# Patient Record
Sex: Male | Born: 1990 | Race: White | Hispanic: No | Marital: Married | Smoking: Former smoker
Health system: Southern US, Community
[De-identification: ages and names within clinical notes are randomized; demographics above are authoritative.]

## PROBLEM LIST (undated history)

## (undated) DIAGNOSIS — S069XAA Unspecified intracranial injury with loss of consciousness status unknown, initial encounter: Secondary | ICD-10-CM

## (undated) DIAGNOSIS — Z8739 Personal history of other diseases of the musculoskeletal system and connective tissue: Secondary | ICD-10-CM

## (undated) DIAGNOSIS — G43909 Migraine, unspecified, not intractable, without status migrainosus: Secondary | ICD-10-CM

## (undated) DIAGNOSIS — G629 Polyneuropathy, unspecified: Secondary | ICD-10-CM

## (undated) DIAGNOSIS — M1990 Unspecified osteoarthritis, unspecified site: Secondary | ICD-10-CM

## (undated) DIAGNOSIS — F4310 Post-traumatic stress disorder, unspecified: Secondary | ICD-10-CM

## (undated) DIAGNOSIS — E079 Disorder of thyroid, unspecified: Secondary | ICD-10-CM

## (undated) DIAGNOSIS — S069X9A Unspecified intracranial injury with loss of consciousness of unspecified duration, initial encounter: Secondary | ICD-10-CM

## (undated) HISTORY — DX: Personal history of other diseases of the musculoskeletal system and connective tissue: Z87.39

## (undated) HISTORY — DX: Polyneuropathy, unspecified: G62.9

## (undated) HISTORY — PX: NASAL SEPTUM SURGERY: SHX37

## (undated) HISTORY — DX: Unspecified osteoarthritis, unspecified site: M19.90

## (undated) HISTORY — DX: Disorder of thyroid, unspecified: E07.9

## (undated) HISTORY — PX: LASIK: SHX215

## (undated) HISTORY — PX: WISDOM TOOTH EXTRACTION: SHX21

## (undated) HISTORY — DX: Migraine, unspecified, not intractable, without status migrainosus: G43.909

---

## 2016-09-01 ENCOUNTER — Other Ambulatory Visit: Payer: Self-pay | Admitting: Orthopedic Surgery

## 2016-09-01 DIAGNOSIS — S62211A Bennett's fracture, right hand, initial encounter for closed fracture: Secondary | ICD-10-CM

## 2016-09-04 ENCOUNTER — Ambulatory Visit
Admission: RE | Admit: 2016-09-04 | Discharge: 2016-09-04 | Disposition: A | Discharge status: Home / Self Care | Payer: No Typology Code available for payment source | Source: Ambulatory Visit | Attending: Orthopedic Surgery | Admitting: Orthopedic Surgery

## 2016-09-04 DIAGNOSIS — S62211A Bennett's fracture, right hand, initial encounter for closed fracture: Secondary | ICD-10-CM

## 2016-09-05 ENCOUNTER — Other Ambulatory Visit: Payer: Self-pay

## 2016-09-12 ENCOUNTER — Encounter (HOSPITAL_COMMUNITY): Payer: Self-pay | Admitting: *Deleted

## 2016-09-13 NOTE — Progress Notes (Signed)
SDW-pre-op call completed yesterday; pt updated with new arrival time for surgery tomorrow ( 2:15pm)

## 2016-09-14 ENCOUNTER — Encounter (HOSPITAL_COMMUNITY): Payer: Self-pay | Admitting: *Deleted

## 2016-09-14 ENCOUNTER — Ambulatory Visit (HOSPITAL_COMMUNITY)
Admission: RE | Admit: 2016-09-14 | Discharge: 2016-09-14 | Disposition: A | Discharge status: Home / Self Care | Payer: Non-veteran care | Source: Ambulatory Visit | Attending: Orthopedic Surgery | Admitting: Orthopedic Surgery

## 2016-09-14 ENCOUNTER — Ambulatory Visit (HOSPITAL_COMMUNITY): Payer: Non-veteran care | Admitting: Certified Registered"

## 2016-09-14 ENCOUNTER — Encounter (HOSPITAL_COMMUNITY)
Admission: RE | Disposition: A | Discharge status: Home / Self Care | Payer: Self-pay | Source: Ambulatory Visit | Attending: Orthopedic Surgery

## 2016-09-14 DIAGNOSIS — S62231P Other displaced fracture of base of first metacarpal bone, right hand, subsequent encounter for fracture with malunion: Secondary | ICD-10-CM | POA: Diagnosis not present

## 2016-09-14 DIAGNOSIS — F4310 Post-traumatic stress disorder, unspecified: Secondary | ICD-10-CM | POA: Diagnosis not present

## 2016-09-14 DIAGNOSIS — Z87891 Personal history of nicotine dependence: Secondary | ICD-10-CM | POA: Diagnosis not present

## 2016-09-14 DIAGNOSIS — Z87820 Personal history of traumatic brain injury: Secondary | ICD-10-CM | POA: Diagnosis not present

## 2016-09-14 HISTORY — DX: Post-traumatic stress disorder, unspecified: F43.10

## 2016-09-14 HISTORY — PX: PERCUTANEOUS PINNING: SHX2209

## 2016-09-14 HISTORY — DX: Unspecified intracranial injury with loss of consciousness of unspecified duration, initial encounter: S06.9X9A

## 2016-09-14 HISTORY — DX: Unspecified intracranial injury with loss of consciousness status unknown, initial encounter: S06.9XAA

## 2016-09-14 LAB — CBC
HEMATOCRIT: 41.7 % (ref 39.0–52.0)
HEMOGLOBIN: 14.7 g/dL (ref 13.0–17.0)
MCH: 30.7 pg (ref 26.0–34.0)
MCHC: 35.3 g/dL (ref 30.0–36.0)
MCV: 87.1 fL (ref 78.0–100.0)
Platelets: 274 10*3/uL (ref 150–400)
RBC: 4.79 MIL/uL (ref 4.22–5.81)
RDW: 12.4 % (ref 11.5–15.5)
WBC: 8.2 10*3/uL (ref 4.0–10.5)

## 2016-09-14 SURGERY — PINNING, EXTREMITY, PERCUTANEOUS
Anesthesia: General | Site: Hand | Laterality: Right

## 2016-09-14 MED ORDER — LIDOCAINE 2% (20 MG/ML) 5 ML SYRINGE
INTRAMUSCULAR | Status: AC
  Filled 2016-09-14: qty 5

## 2016-09-14 MED ORDER — FENTANYL CITRATE (PF) 100 MCG/2ML IJ SOLN
INTRAMUSCULAR | Status: DC | PRN
  Administered 2016-09-14 (×2): 50 ug via INTRAVENOUS
  Administered 2016-09-14: 100 ug via INTRAVENOUS
  Administered 2016-09-14 (×4): 50 ug via INTRAVENOUS

## 2016-09-14 MED ORDER — LABETALOL HCL 5 MG/ML IV SOLN
INTRAVENOUS | Status: AC
  Filled 2016-09-14: qty 4

## 2016-09-14 MED ORDER — CEFAZOLIN SODIUM 1 G IJ SOLR
INTRAMUSCULAR | Status: AC
  Filled 2016-09-14: qty 20

## 2016-09-14 MED ORDER — LACTATED RINGERS IV SOLN
INTRAVENOUS | Status: DC
  Administered 2016-09-14 (×3): via INTRAVENOUS

## 2016-09-14 MED ORDER — FENTANYL CITRATE (PF) 100 MCG/2ML IJ SOLN
INTRAMUSCULAR | Status: AC
  Filled 2016-09-14: qty 4

## 2016-09-14 MED ORDER — HYDROMORPHONE HCL 1 MG/ML IJ SOLN
INTRAMUSCULAR | Status: AC
  Administered 2016-09-14: 0.5 mg via INTRAVENOUS
  Filled 2016-09-14: qty 1

## 2016-09-14 MED ORDER — HYDROMORPHONE HCL 1 MG/ML IJ SOLN
1.0000 mg | Freq: Once | INTRAMUSCULAR | Status: AC
  Administered 2016-09-14: 1 mg via INTRAVENOUS

## 2016-09-14 MED ORDER — FENTANYL CITRATE (PF) 100 MCG/2ML IJ SOLN
INTRAMUSCULAR | Status: AC
  Filled 2016-09-14: qty 2

## 2016-09-14 MED ORDER — PHENYLEPHRINE 40 MCG/ML (10ML) SYRINGE FOR IV PUSH (FOR BLOOD PRESSURE SUPPORT)
PREFILLED_SYRINGE | INTRAVENOUS | Status: AC
  Filled 2016-09-14: qty 30

## 2016-09-14 MED ORDER — FENTANYL CITRATE (PF) 100 MCG/2ML IJ SOLN
INTRAMUSCULAR | Status: AC
  Administered 2016-09-14: 100 ug via INTRAVENOUS
  Filled 2016-09-14: qty 2

## 2016-09-14 MED ORDER — PROPOFOL 10 MG/ML IV BOLUS
INTRAVENOUS | Status: AC
  Filled 2016-09-14: qty 20

## 2016-09-14 MED ORDER — ONDANSETRON HCL 4 MG/2ML IJ SOLN: 4.0000 mg | Freq: Once | INTRAMUSCULAR | Status: DC | PRN

## 2016-09-14 MED ORDER — OXYCODONE HCL 5 MG PO TABS
5.0000 mg | ORAL_TABLET | Freq: Once | ORAL | Status: AC
  Administered 2016-09-14: 5 mg via ORAL

## 2016-09-14 MED ORDER — PROPOFOL 10 MG/ML IV BOLUS
INTRAVENOUS | Status: DC | PRN
  Administered 2016-09-14: 300 mg via INTRAVENOUS

## 2016-09-14 MED ORDER — MEPERIDINE HCL 25 MG/ML IJ SOLN: 6.2500 mg | INTRAMUSCULAR | Status: DC | PRN

## 2016-09-14 MED ORDER — MIDAZOLAM HCL 2 MG/2ML IJ SOLN
INTRAMUSCULAR | Status: AC
  Filled 2016-09-14: qty 2

## 2016-09-14 MED ORDER — OXYCODONE HCL 5 MG PO TABS
ORAL_TABLET | ORAL | Status: AC
  Filled 2016-09-14: qty 1

## 2016-09-14 MED ORDER — LIDOCAINE HCL (CARDIAC) 20 MG/ML IV SOLN
INTRAVENOUS | Status: DC | PRN
  Administered 2016-09-14: 20 mg via INTRATRACHEAL

## 2016-09-14 MED ORDER — DEXAMETHASONE SODIUM PHOSPHATE 10 MG/ML IJ SOLN
INTRAMUSCULAR | Status: AC
  Filled 2016-09-14: qty 1

## 2016-09-14 MED ORDER — CEFAZOLIN SODIUM 1 G IJ SOLR
INTRAMUSCULAR | Status: DC | PRN
  Administered 2016-09-14: 2 g via INTRAMUSCULAR

## 2016-09-14 MED ORDER — LIDOCAINE 2% (20 MG/ML) 5 ML SYRINGE
INTRAMUSCULAR | Status: AC
  Filled 2016-09-14: qty 20

## 2016-09-14 MED ORDER — EPHEDRINE 5 MG/ML INJ
INTRAVENOUS | Status: AC
  Filled 2016-09-14: qty 20

## 2016-09-14 MED ORDER — ONDANSETRON HCL 4 MG/2ML IJ SOLN
INTRAMUSCULAR | Status: AC
  Filled 2016-09-14: qty 10

## 2016-09-14 MED ORDER — DEXAMETHASONE SODIUM PHOSPHATE 10 MG/ML IJ SOLN
INTRAMUSCULAR | Status: DC | PRN
  Administered 2016-09-14: 10 mg via INTRAVENOUS

## 2016-09-14 MED ORDER — FENTANYL CITRATE (PF) 100 MCG/2ML IJ SOLN
100.0000 ug | Freq: Once | INTRAMUSCULAR | Status: AC
  Administered 2016-09-14: 100 ug via INTRAVENOUS
  Filled 2016-09-14: qty 2

## 2016-09-14 MED ORDER — HYDROMORPHONE HCL 1 MG/ML IJ SOLN
INTRAMUSCULAR | Status: AC
  Filled 2016-09-14: qty 1

## 2016-09-14 MED ORDER — HYDROMORPHONE HCL 1 MG/ML IJ SOLN
0.2500 mg | INTRAMUSCULAR | Status: DC | PRN
  Administered 2016-09-14 (×4): 0.5 mg via INTRAVENOUS

## 2016-09-14 MED ORDER — MIDAZOLAM HCL 2 MG/2ML IJ SOLN
INTRAMUSCULAR | Status: DC | PRN
  Administered 2016-09-14: 2 mg via INTRAVENOUS

## 2016-09-14 MED ORDER — OXYCODONE HCL 5 MG PO TABS
5.0000 mg | ORAL_TABLET | Freq: Once | ORAL | Status: AC | PRN
  Administered 2016-09-14: 5 mg via ORAL

## 2016-09-14 MED ORDER — DEXMEDETOMIDINE HCL IN NACL 200 MCG/50ML IV SOLN
INTRAVENOUS | Status: AC
  Filled 2016-09-14: qty 50

## 2016-09-14 MED ORDER — DEXMEDETOMIDINE HCL 200 MCG/2ML IV SOLN
INTRAVENOUS | Status: DC | PRN
  Administered 2016-09-14 (×3): 4 ug via INTRAVENOUS
  Administered 2016-09-14: 16 ug via INTRAVENOUS

## 2016-09-14 MED ORDER — 0.9 % SODIUM CHLORIDE (POUR BTL) OPTIME
TOPICAL | Status: DC | PRN
  Administered 2016-09-14: 1000 mL

## 2016-09-14 MED ORDER — OXYCODONE HCL 5 MG/5ML PO SOLN: 5.0000 mg | Freq: Once | ORAL | Status: AC | PRN

## 2016-09-14 MED ORDER — SUCCINYLCHOLINE CHLORIDE 200 MG/10ML IV SOSY
PREFILLED_SYRINGE | INTRAVENOUS | Status: AC
  Filled 2016-09-14: qty 30

## 2016-09-14 MED ORDER — ONDANSETRON HCL 4 MG/2ML IJ SOLN
INTRAMUSCULAR | Status: DC | PRN
  Administered 2016-09-14: 4 mg via INTRAVENOUS

## 2016-09-14 SURGICAL SUPPLY — 44 items
BANDAGE ACE 3X5.8 VEL STRL LF (GAUZE/BANDAGES/DRESSINGS) ×3 IMPLANT
BANDAGE ACE 4X5 VEL STRL LF (GAUZE/BANDAGES/DRESSINGS) IMPLANT
BANDAGE ELASTIC 3 VELCRO ST LF (GAUZE/BANDAGES/DRESSINGS) ×3 IMPLANT
BENZOIN TINCTURE PRP APPL 2/3 (GAUZE/BANDAGES/DRESSINGS) IMPLANT
BLADE SURG ROTATE 9660 (MISCELLANEOUS) IMPLANT
BNDG CONFORM 2 STRL LF (GAUZE/BANDAGES/DRESSINGS) ×3 IMPLANT
BNDG ELASTIC 2 VLCR STRL LF (GAUZE/BANDAGES/DRESSINGS) ×3 IMPLANT
BNDG ESMARK 4X9 LF (GAUZE/BANDAGES/DRESSINGS) ×3 IMPLANT
BNDG GAUZE ELAST 4 BULKY (GAUZE/BANDAGES/DRESSINGS) ×3 IMPLANT
CLOSURE WOUND 1/2 X4 (GAUZE/BANDAGES/DRESSINGS)
COVER SURGICAL LIGHT HANDLE (MISCELLANEOUS) ×3 IMPLANT
CUFF TOURNIQUET SINGLE 18IN (TOURNIQUET CUFF) IMPLANT
CUFF TOURNIQUET SINGLE 24IN (TOURNIQUET CUFF) ×3 IMPLANT
DRAPE INCISE IOBAN 66X45 STRL (DRAPES) ×3 IMPLANT
DRAPE OEC MINIVIEW 54X84 (DRAPES) ×3 IMPLANT
DRSG EMULSION OIL 3X3 NADH (GAUZE/BANDAGES/DRESSINGS) IMPLANT
GAUZE SPONGE 4X4 12PLY STRL (GAUZE/BANDAGES/DRESSINGS) ×3 IMPLANT
GAUZE XEROFORM 1X8 LF (GAUZE/BANDAGES/DRESSINGS) ×3 IMPLANT
GLOVE BIOGEL PI IND STRL 8.5 (GLOVE) ×1 IMPLANT
GLOVE BIOGEL PI INDICATOR 8.5 (GLOVE) ×2
GLOVE SURG ORTHO 8.0 STRL STRW (GLOVE) ×3 IMPLANT
GOWN STRL REUS W/ TWL LRG LVL3 (GOWN DISPOSABLE) ×2 IMPLANT
GOWN STRL REUS W/TWL LRG LVL3 (GOWN DISPOSABLE) ×4
K-WIRE 1.1 (WIRE) ×6
K-WIRE FX150X1.1XTROC TIP (WIRE) ×3
KIT BASIN OR (CUSTOM PROCEDURE TRAY) ×3 IMPLANT
KIT ROOM TURNOVER OR (KITS) ×3 IMPLANT
KWIRE FX150X1.1XTROC TIP (WIRE) ×3 IMPLANT
NS IRRIG 1000ML POUR BTL (IV SOLUTION) ×3 IMPLANT
PACK ORTHO EXTREMITY (CUSTOM PROCEDURE TRAY) ×3 IMPLANT
PAD ARMBOARD 7.5X6 YLW CONV (MISCELLANEOUS) ×6 IMPLANT
SOAP 2 % CHG 4 OZ (WOUND CARE) ×3 IMPLANT
SPLINT FIBERGLASS 3X12 (CAST SUPPLIES) ×3 IMPLANT
STRIP CLOSURE SKIN 1/2X4 (GAUZE/BANDAGES/DRESSINGS) IMPLANT
SUT ETHILON 4 0 P 3 18 (SUTURE) IMPLANT
SUT ETHILON 5 0 P 3 18 (SUTURE)
SUT FIBER WIRE 4.0 (SUTURE) ×6 IMPLANT
SUT MNCRL AB 4-0 PS2 18 (SUTURE) ×3 IMPLANT
SUT NYLON ETHILON 5-0 P-3 1X18 (SUTURE) IMPLANT
SUT PROLENE 4 0 P 3 18 (SUTURE) IMPLANT
SUT PROLENE 4 0 PS 2 18 (SUTURE) ×3 IMPLANT
TOWEL OR 17X24 6PK STRL BLUE (TOWEL DISPOSABLE) ×3 IMPLANT
TOWEL OR 17X26 10 PK STRL BLUE (TOWEL DISPOSABLE) ×3 IMPLANT
WATER STERILE IRR 1000ML POUR (IV SOLUTION) IMPLANT

## 2016-09-14 NOTE — H&P (Signed)
Billey ChangJoshua Prestigiacomo is an 25 y.o. male.   Chief Complaint: right thumb injury HPI: Pt seen/evaluated in office Pt with right thumb metacarpal base fracture Pt here for surgery  No prior surgery to right thumb  Past Medical History:  Diagnosis Date  . Head injury with loss of consciousness (HCC)     < 30 minutes  . PTSD (post-traumatic stress disorder)   . TBI (traumatic brain injury) [facility](HCC)     Past Surgical History:  Procedure Laterality Date  . LASIK    . NASAL SEPTUM SURGERY    . WISDOM TOOTH EXTRACTION      History reviewed. No pertinent family history. Social History:  reports that he has quit smoking. He has quit using smokeless tobacco. His smokeless tobacco use included Chew. He reports that he drinks about 4.8 oz of alcohol per week . He reports that he does not use drugs.  Allergies: No Known Allergies  Medications Prior to Admission  Medication Sig Dispense Refill  . HYDROcodone-acetaminophen (NORCO/VICODIN) 5-325 MG tablet TAKE 1 TABLET BY MOUTH 3 OR 4 TIMES DAILY AS NEEDED FOR PAIN  0    Results for orders placed or performed during the hospital encounter of 09/14/16 (from the past 48 hour(s))  CBC     Status: None   Collection Time: 09/14/16  2:35 PM  Result Value Ref Range   WBC 8.2 4.0 - 10.5 K/uL   RBC 4.79 4.22 - 5.81 MIL/uL   Hemoglobin 14.7 13.0 - 17.0 g/dL   HCT 86.541.7 78.439.0 - 69.652.0 %   MCV 87.1 78.0 - 100.0 fL   MCH 30.7 26.0 - 34.0 pg   MCHC 35.3 30.0 - 36.0 g/dL   RDW 29.512.4 28.411.5 - 13.215.5 %   Platelets 274 150 - 400 K/uL   No results found.  ROS NO RECENT ILLNESSES OR HOSPITALIZATIONS  Blood pressure 125/74, pulse 74, temperature 97.9 F (36.6 C), temperature source Oral, resp. rate 18, height 5\' 10"  (1.778 m), weight 285 lb (129.3 kg), SpO2 100 %. Physical Exam  General Appearance:  Alert, cooperative, no distress, appears stated age  Head:  Normocephalic, without obvious abnormality, atraumatic  Eyes:  Pupils equal, conjunctiva/corneas clear,          Throat: Lips, mucosa, and tongue normal; teeth and gums normal  Neck: No visible masses     Lungs:   respirations unlabored  Chest Wall:  No tenderness or deformity  Heart:  Regular rate and rhythm,  Abdomen:   Soft, non-tender,         Extremities: TTP OVER BASE OF RIGHT THUMB, SKIN INTACT FINGERS WARM WELL PERFUSED GOOD DIGITAL MOBILITY NVI RIGHT HAND  Pulses: 2+ and symmetric  Skin: Skin color, texture, turgor normal, no rashes or lesions     Neurologic: Normal    Assessment/Plan RIGHT THUMB METACARPAL BASE FRACTURE/DISPLACED  RIGHT THUMB CLOSED REDUCTION AND PINNING POSSIBLE OPEN REDUCTION AND INTERNAL FIXATION AND REPAIR AS INDICATED  R/B/A DISCUSSED WITH PT IN OFFICE.  PT VOICED UNDERSTANDING OF PLAN CONSENT SIGNED DAY OF SURGERY PT SEEN AND EXAMINED PRIOR TO OPERATIVE PROCEDURE/DAY OF SURGERY SITE MARKED. QUESTIONS ANSWERED WILL GO HOME FOLLOWING SURGERY  WE ARE PLANNING SURGERY FOR YOUR UPPER EXTREMITY. THE RISKS AND BENEFITS OF SURGERY INCLUDE BUT NOT LIMITED TO BLEEDING INFECTION, DAMAGE TO NEARBY NERVES ARTERIES TENDONS, FAILURE OF SURGERY TO ACCOMPLISH ITS INTENDED GOALS, PERSISTENT SYMPTOMS AND NEED FOR FURTHER SURGICAL INTERVENTION. WITH THIS IN MIND WE WILL PROCEED. I HAVE DISCUSSED WITH THE PATIENT THE  PRE AND POSTOPERATIVE REGIMEN AND THE DOS AND DON'TS. PT VOICED UNDERSTANDING AND INFORMED CONSENT SIGNED.  Sharma Covert 09/14/2016, 4:44 PM

## 2016-09-14 NOTE — Transfer of Care (Signed)
Immediate Anesthesia Transfer of Care Note  Patient: Peter Hodges  Procedure(s) Performed: Procedure(s): OPEN REDUCTION METACARPAL WITH PERCUTANEOUS PINNING WITH MANIPULATION RIGHT HAND (Right)  Patient Location: PACU  Anesthesia Type:General  Level of Consciousness: awake, alert , oriented and patient cooperative  Airway & Oxygen Therapy: Patient Spontanous Breathing  Post-op Assessment: Report given to RN and Post -op Vital signs reviewed and stable  Post vital signs: Reviewed and stable  Last Vitals:  Vitals:   09/14/16 1418 09/14/16 1902  BP: 125/74   Pulse: 74   Resp: 18   Temp: 36.6 C 36.6 C    Last Pain:  Vitals:   09/14/16 1902  TempSrc:   PainSc: (P) 5       Patients Stated Pain Goal: 7 (09/14/16 1416)  Complications: No apparent anesthesia complications

## 2016-09-14 NOTE — Discharge Instructions (Signed)
KEEP BANDAGE CLEAN AND DRY °CALL OFFICE FOR F/U APPT [phone] °Dr Illana Nolting [phone] °KEEP HAND ELEVATED ABOVE HEART °OK TO APPLY ICE TO OPERATIVE AREA °CONTACT OFFICE IF ANY WORSENING PAIN OR CONCERNS. °

## 2016-09-14 NOTE — Anesthesia Procedure Notes (Signed)
Procedure Name: LMA Insertion Date/Time: 09/14/2016 5:25 PM Performed by: Rosiland OzMEYERS, Tariya Morrissette Pre-anesthesia Checklist: Patient identified, Emergency Drugs available, Suction available, Patient being monitored and Timeout performed Patient Re-evaluated:Patient Re-evaluated prior to inductionOxygen Delivery Method: Circle system utilized Preoxygenation: Pre-oxygenation with 100% oxygen Intubation Type: IV induction LMA: LMA inserted LMA Size: 5.0 Number of attempts: 1 Placement Confirmation: positive ETCO2 and breath sounds checked- equal and bilateral Tube secured with: Tape Dental Injury: Teeth and Oropharynx as per pre-operative assessment

## 2016-09-14 NOTE — Anesthesia Postprocedure Evaluation (Signed)
Anesthesia Post Note  Patient: Peter Hodges  Procedure(s) Performed: Procedure(s) (LRB): OPEN REDUCTION METACARPAL WITH PERCUTANEOUS PINNING WITH MANIPULATION RIGHT HAND (Right)  Patient location during evaluation: PACU Anesthesia Type: General Level of consciousness: awake and alert Pain management: pain level controlled Vital Signs Assessment: post-procedure vital signs reviewed and stable Respiratory status: spontaneous breathing, nonlabored ventilation and respiratory function stable Cardiovascular status: blood pressure returned to baseline and stable Postop Assessment: no signs of nausea or vomiting Anesthetic complications: no    Last Vitals:  Vitals:   09/14/16 2003 09/14/16 2011  BP: 108/71 113/70  Pulse: 89 89  Resp: 12 17  Temp:  36.4 C    Last Pain:  Vitals:   09/14/16 2011  TempSrc:   PainSc: 4                  Noell Shular A

## 2016-09-14 NOTE — Progress Notes (Signed)
Patient still complaining of significant pain after all ordered narcotics were given.  Notified Dr. Ivin Bootyrews.  New orders received, will administer medications.

## 2016-09-14 NOTE — Anesthesia Preprocedure Evaluation (Signed)
Anesthesia Evaluation  Patient identified by MRN, date of birth, ID band Patient awake    Reviewed: Allergy & Precautions, NPO status , Patient's Chart, lab work & pertinent test results  History of Anesthesia Complications Negative for: history of anesthetic complications  Airway Mallampati: II  TM Distance: >3 FB Neck ROM: Full    Dental  (+) Teeth Intact   Pulmonary former smoker,    breath sounds clear to auscultation       Cardiovascular negative cardio ROS   Rhythm:Regular     Neuro/Psych negative neurological ROS  negative psych ROS   GI/Hepatic negative GI ROS, Neg liver ROS,   Endo/Other  Morbid obesity  Renal/GU negative Renal ROS     Musculoskeletal   Abdominal   Peds  Hematology negative hematology ROS (+)   Anesthesia Other Findings   Reproductive/Obstetrics                             Anesthesia Physical Anesthesia Plan  ASA: II  Anesthesia Plan: General   Post-op Pain Management:    Induction: Intravenous  Airway Management Planned: LMA  Additional Equipment: None  Intra-op Plan:   Post-operative Plan: Extubation in OR  Informed Consent: I have reviewed the patients History and Physical, chart, labs and discussed the procedure including the risks, benefits and alternatives for the proposed anesthesia with the patient or authorized representative who has indicated his/her understanding and acceptance.   Dental advisory given  Plan Discussed with: CRNA and Surgeon  Anesthesia Plan Comments:         Anesthesia Quick Evaluation

## 2016-09-15 ENCOUNTER — Encounter (HOSPITAL_COMMUNITY): Payer: Self-pay | Admitting: Orthopedic Surgery

## 2016-09-15 NOTE — Op Note (Signed)
NAME:  Peter Hodges, Peter             ACCOUNT NO.:  [id number]  MEDICAL RECORD NO.:  [id number]  LOCATION:  [facility]                         FACILITY:  [facility]  PHYSICIAN:  Sharma CovertFred W. Maili Shutters IV, M.D.DATE OF BIRTH:  01/28/1991  DATE OF PROCEDURE:  09/14/2016 DATE OF DISCHARGE:  09/14/2016                              OPERATIVE REPORT   PREOPERATIVE DIAGNOSIS:  Right thumb metacarpal malunion, intra- articular carpometacarpal malunion.  POSTOPERATIVE DIAGNOSIS:  Right thumb metacarpal malunion, intra- articular carpometacarpal malunion.  ATTENDING PHYSICIAN:  Sharma CovertFred W. Deborra Phegley, M.D., who scrubbed and present for the entire procedure.  ASSISTANT SURGEON:  None.  ANESTHESIA:  General via LMA.  SURGICAL TREATMENTS: 1. Open treatment of right small finger metacarpal malunion, open     treatment of metacarpal fracture involving the articular surface of     the thumb basilar joint. 2. Radiographs 3 views, right thumb.  SURGICAL IMPLANTS:  Three 0.045 K-wires.  SURGICAL INDICATIONS:  Mr. Peter Hodges is a right-hand-dominant gentleman, who sustained a right thumb fracture involving the articular surface of the thumb CMC joint.  The patient had a delay in treatment and was sent to my office for further evaluation and treatment.  The patient was seen and evaluated and recommended to undergo the above procedure.  Risks, benefits, and alternatives were discussed in detail with the patient and signed informed consent was obtained.  Risks include, but are not limited to bleeding, infection, damage to nearby nerves, arteries, or tendons, nonunion, malunion, hardware failure, loss of motion of the wrist and digits, incomplete relief of symptoms, and need for further surgical intervention.  INTRAOPERATIVE FINDINGS:  The patient did have the malunion of the metacarpal, it was not completely united.  The patient did have a highly- comminuted intra-articular fracture with several loose cartilaginous pieces  that were later removed.  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  DESCRIPTION OF PROCEDURE:  The patient was properly identified in the preoperative holding area and marked with a permanent marker made on the right thumb to indicate the correct operative site.  The patient was brought back to the operating room, placed supine on the anesthesia room table, and general anesthesia was administered.  The patient tolerated this well.  A well-padded tourniquet was placed on the right brachium and sealed with 1000 drape.  Right upper extremity was then prepped and draped in a normal sterile fashion.  Time-out was called.  Correct site was identified and the procedure was then begun.  Attention was then turned to the right thumb, where a closed manipulation was unsuccessful after several attempts.  Following this, a longitudinal incision was made directly over the dorsal aspect of the thumb CMC joint.  Dissection was carried down through the skin and subcutaneous tissue.  Going between the interval of the EPB and the EPL, large thick capsular flaps were then raised and the [facility] was then exposed.  Takedown on the malunion was then carried out with a rongeur, and the fracture site was then opened up with [employer] elevators.  Several loose cartilaginous fragments were removed.  The wound was then thoroughly irrigated.  The joint was then  inspected, and in order to restore the articular congruity, the patient had 1 large dorsal piece, which had the attachment of the dorsal radial carpal ligament and its complex attached to it and this was reduced.  Two 0.045 K-wires were then placed across the thumb CMC joint.  An additional longitudinal K-wire was then placed as well for additional fixation across the [facility] joint.  The K-wires were then cut and left out of the skin.  Final radiographs were then obtained.  This was a very  complex greater than a three-part intra- articular fracture involving the CMC joint.  The goal of the surgery was to restore some articular congruity knowing that there is going to be loss of the articular congruity given the cartilaginous pieces that were removed.  This was not an acute fracture.  This was an intra-articular fracture again greater than 3 or more fragments within the [facility] joint, Rolando-type injury.  After the K-wires were in place, the wound was then thoroughly irrigated.  The thick capsular flaps were then repaired with FiberWire suture, subcutaneous tissues closed with Monocryl, and skin closed with simple Prolene sutures.  Xeroform was applied around the pin sites.  Sterile compressive bandage was then applied.  The patient was placed in a well-padded thumb spica splint, extubated, and taken to the recovery room in good condition.  RADIOGRAPH INTERPRETATION:  AP, lateral, and oblique views of the thumb did show the K-wire fixation in place with relatively good restoration of the joint congruity.  POSTPROCEDURE PLAN:  The patient will be discharged home.  Seen back in my office in approximately 2 weeks for wound check, pin check, x-rays, application of short-arm thumb spica cast.  A total of 5 weeks for the pins to be in.  Seen at the 2-week mark and the 5-week mark.  Pins out in the 5-week mark, then begin an outpatient therapy regimen. Radiographs at each visit.  Prognosis is very guarded given the nature of the injury to the [facility] joint.  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     Madelynn Done, M.D.     FWO/MEDQ  D:  09/14/2016  T:  09/15/2016  Job:  [id number]

## 2018-02-06 IMAGING — CT CT HAND*R* W/O CM
3 of 4 series · 9 of 33 positions shown, 11 images · non-contrast
Comparison: None.

CLINICAL DATA: Right hand fracture.

EXAM:
CT OF THE RIGHT HAND WITHOUT CONTRAST
TECHNIQUE: Multidetector CT imaging of the right hand was performed according
to the standard protocol. Multiplanar CT image reconstructions were
also generated.

[Series 5: thin soft · axial · 0.35mm/px · z∈[+212,+212]mm · 1 of 362 slices shown, 2 images]
[im 181/362  soft-tissue]
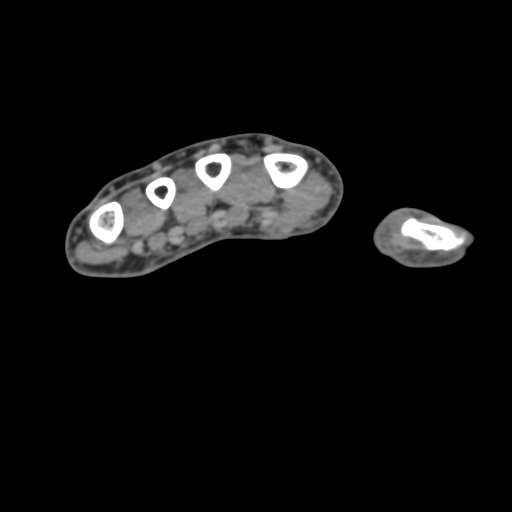
[im 181/362  bone]
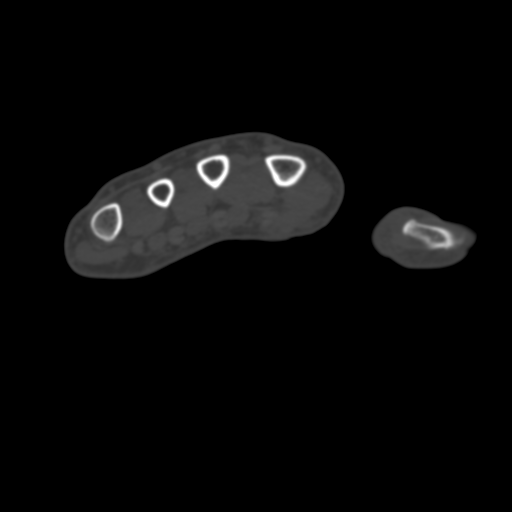

[Series 7: cor soft · coronal · 0.35mm/px · 3 of 33 slices shown]
[im 7/33  bone]
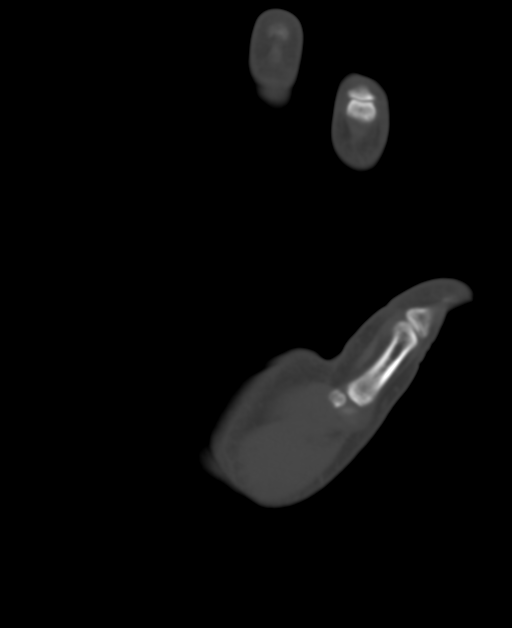
[im 13/33  bone]
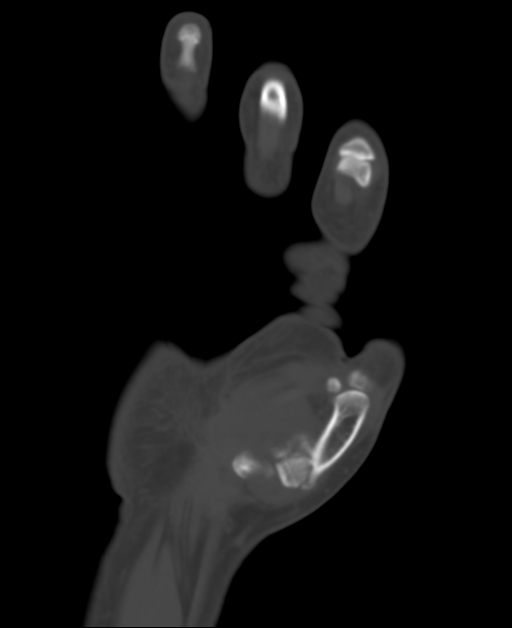
[im 20/33  bone]
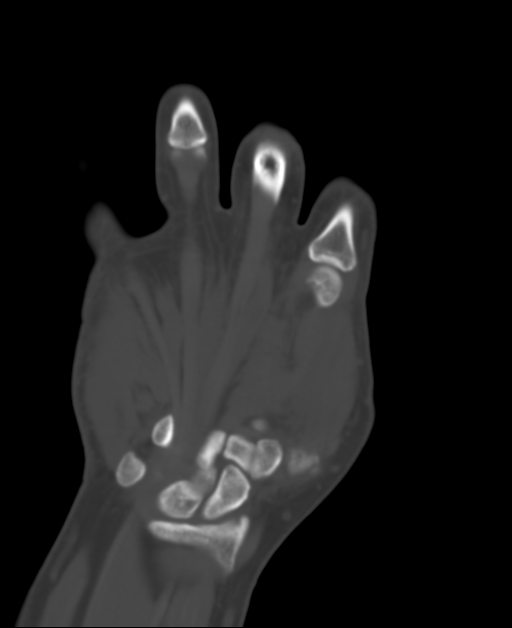

[Series 9: sag soft · sagittal · 0.18mm/px · 5 of 80 slices shown, 6 images]
[im 27/80  bone]
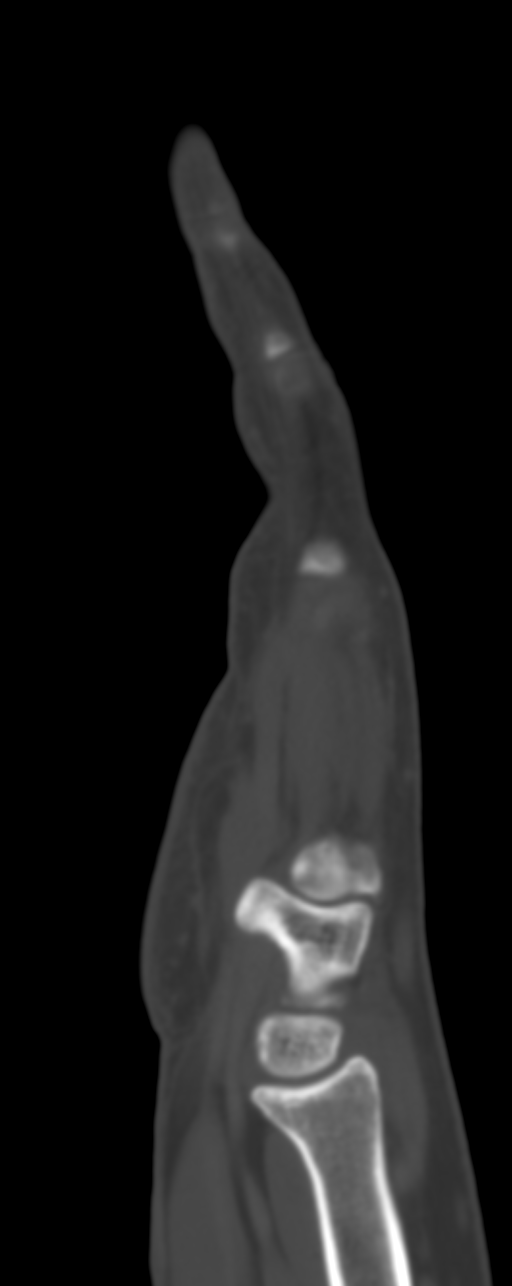
[im 33/80  bone]
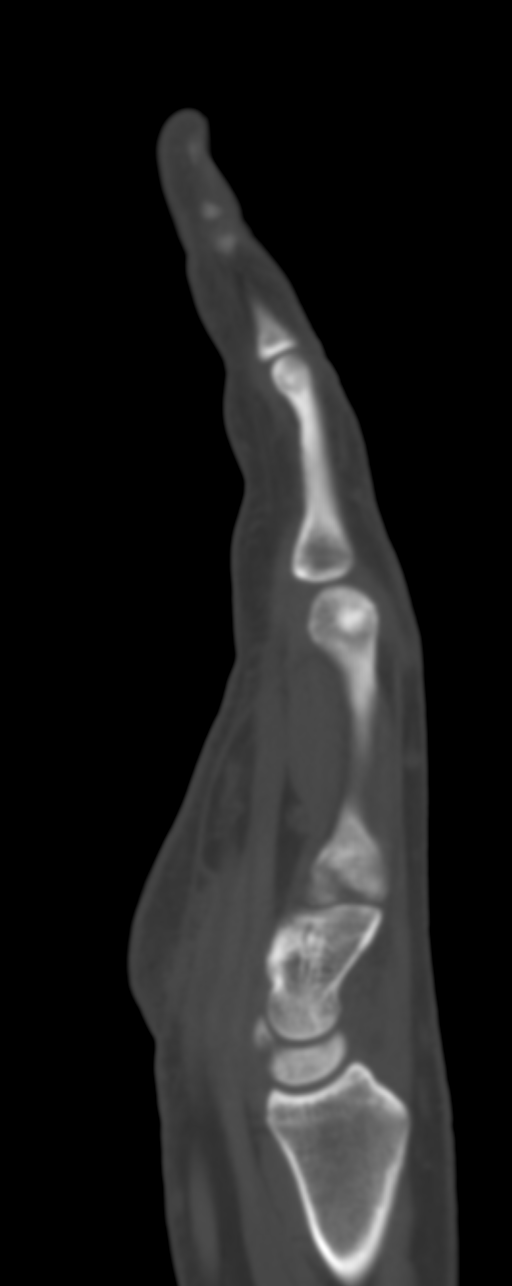
[im 40/80  soft-tissue]
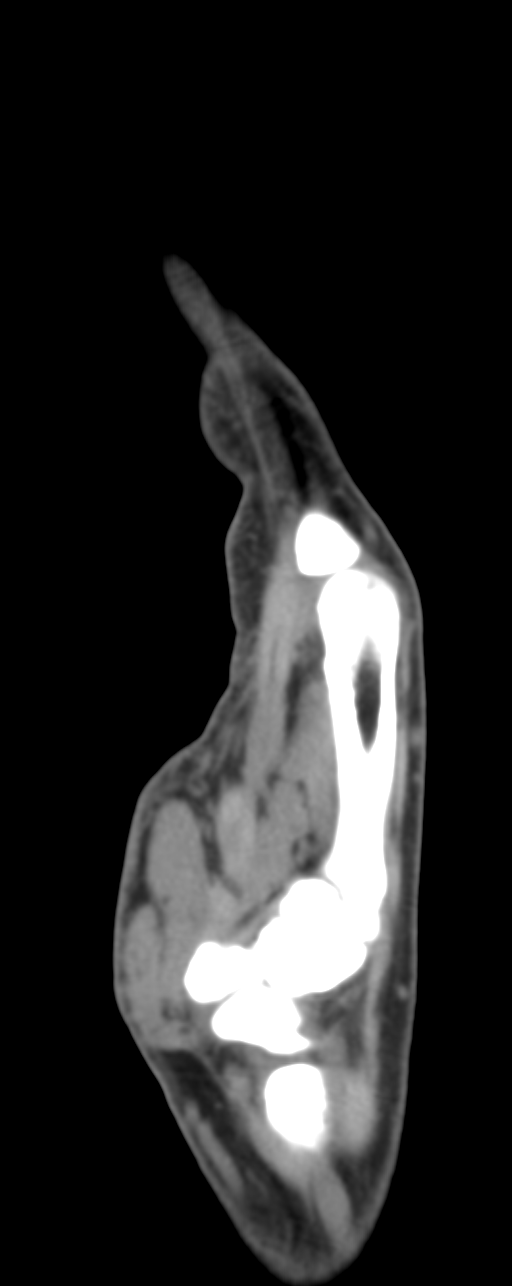
[im 40/80  bone]
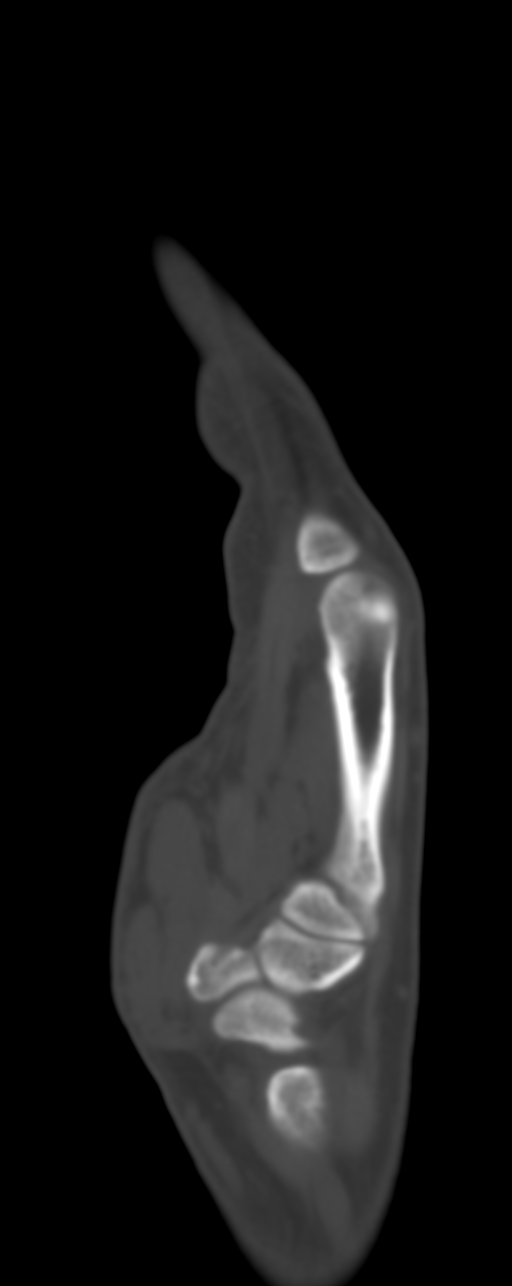
[im 47/80  bone]
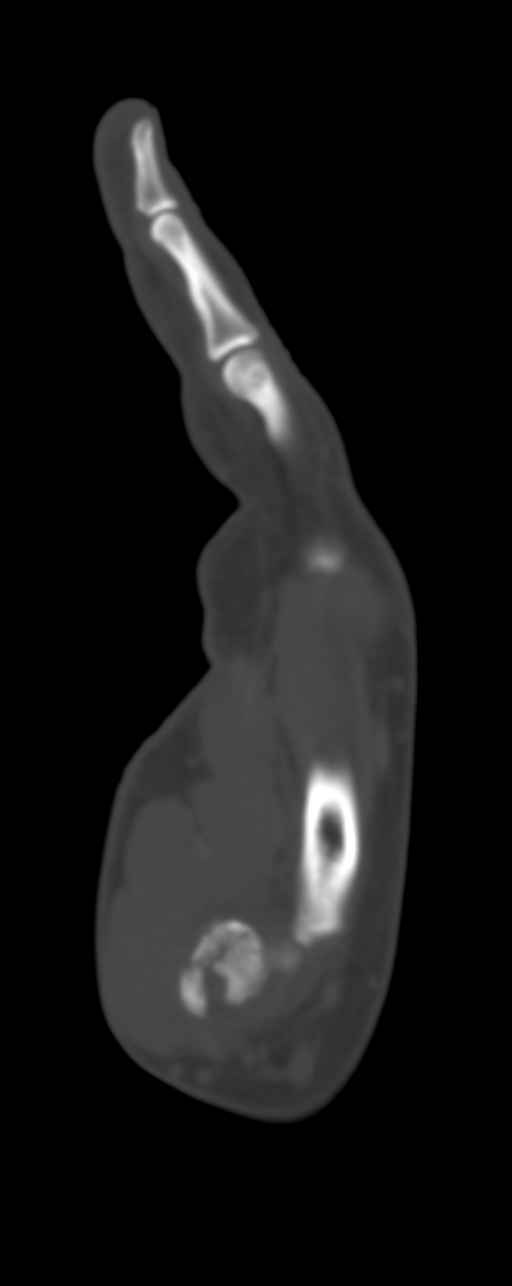
[im 53/80  bone]
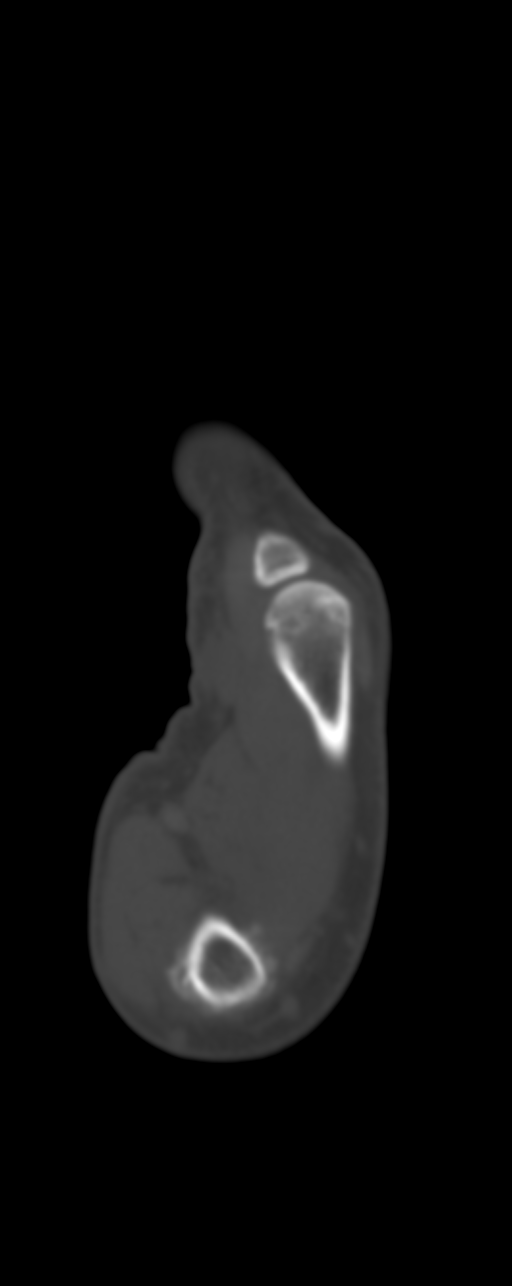

[9 of 33 positions shown; findings below may reference images not displayed]

FINDINGS: Bones/Joint/Cartilage

There is a comminuted impacted fracture of the base of the first
metacarpal. The articular surface of the base is fragmented with a
die punch type impaction measuring 4 x 8 mm, best seen on image 48
of series 8. The patient has already developed callus formation
around the fracture.

There is no dislocation.

The other bones of the hand and wrist demonstrate no significant
abnormality. Muscles and tendons appear normal.
IMPRESSION: Comminuted slightly impacted fracture of the base of the first
metacarpal. Die punch impaction fracture of the articular surface as
described.

## 2020-10-29 DIAGNOSIS — M25871 Other specified joint disorders, right ankle and foot: Secondary | ICD-10-CM | POA: Insufficient documentation

## 2020-10-29 DIAGNOSIS — S82831K Other fracture of upper and lower end of right fibula, subsequent encounter for closed fracture with nonunion: Secondary | ICD-10-CM | POA: Insufficient documentation

## 2020-10-29 DIAGNOSIS — M25571 Pain in right ankle and joints of right foot: Secondary | ICD-10-CM | POA: Insufficient documentation

## 2022-01-06 ENCOUNTER — Telehealth: Payer: Self-pay | Admitting: Oncology

## 2022-01-06 NOTE — Telephone Encounter (Signed)
Patient has been scheduled for his NP appt. Aware of appt date and time.

## 2022-01-20 ENCOUNTER — Other Ambulatory Visit: Payer: No Typology Code available for payment source

## 2022-01-20 ENCOUNTER — Ambulatory Visit: Payer: No Typology Code available for payment source | Admitting: Oncology
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
## 2022-01-22 ENCOUNTER — Other Ambulatory Visit: Payer: Self-pay | Admitting: Oncology

## 2022-01-23 ENCOUNTER — Inpatient Hospital Stay: Payer: No Typology Code available for payment source

## 2022-01-23 ENCOUNTER — Other Ambulatory Visit: Payer: Self-pay | Admitting: Oncology

## 2022-01-23 ENCOUNTER — Encounter: Payer: Self-pay | Admitting: Oncology

## 2022-01-23 ENCOUNTER — Inpatient Hospital Stay: Payer: No Typology Code available for payment source | Attending: Oncology | Admitting: Oncology

## 2022-01-23 ENCOUNTER — Other Ambulatory Visit: Payer: Self-pay

## 2022-01-23 ENCOUNTER — Other Ambulatory Visit: Payer: Self-pay | Admitting: Hematology and Oncology

## 2022-01-23 DIAGNOSIS — Z86718 Personal history of other venous thrombosis and embolism: Secondary | ICD-10-CM | POA: Diagnosis not present

## 2022-01-23 DIAGNOSIS — Z79899 Other long term (current) drug therapy: Secondary | ICD-10-CM

## 2022-01-23 DIAGNOSIS — Z7901 Long term (current) use of anticoagulants: Secondary | ICD-10-CM | POA: Diagnosis not present

## 2022-01-23 DIAGNOSIS — Z8049 Family history of malignant neoplasm of other genital organs: Secondary | ICD-10-CM | POA: Diagnosis not present

## 2022-01-23 DIAGNOSIS — Z8249 Family history of ischemic heart disease and other diseases of the circulatory system: Secondary | ICD-10-CM | POA: Diagnosis not present

## 2022-01-23 DIAGNOSIS — D6862 Lupus anticoagulant syndrome: Secondary | ICD-10-CM | POA: Diagnosis not present

## 2022-01-23 DIAGNOSIS — Z8261 Family history of arthritis: Secondary | ICD-10-CM | POA: Diagnosis not present

## 2022-01-23 DIAGNOSIS — Z808 Family history of malignant neoplasm of other organs or systems: Secondary | ICD-10-CM | POA: Insufficient documentation

## 2022-01-23 DIAGNOSIS — I824Y1 Acute embolism and thrombosis of unspecified deep veins of right proximal lower extremity: Secondary | ICD-10-CM

## 2022-01-23 DIAGNOSIS — Z86711 Personal history of pulmonary embolism: Secondary | ICD-10-CM | POA: Insufficient documentation

## 2022-01-23 DIAGNOSIS — K922 Gastrointestinal hemorrhage, unspecified: Secondary | ICD-10-CM

## 2022-01-23 DIAGNOSIS — R609 Edema, unspecified: Secondary | ICD-10-CM

## 2022-01-23 DIAGNOSIS — R042 Hemoptysis: Secondary | ICD-10-CM | POA: Diagnosis not present

## 2022-01-23 DIAGNOSIS — R319 Hematuria, unspecified: Secondary | ICD-10-CM | POA: Diagnosis not present

## 2022-01-23 DIAGNOSIS — I2699 Other pulmonary embolism without acute cor pulmonale: Secondary | ICD-10-CM | POA: Diagnosis not present

## 2022-01-23 DIAGNOSIS — G629 Polyneuropathy, unspecified: Secondary | ICD-10-CM | POA: Diagnosis not present

## 2022-01-23 DIAGNOSIS — R269 Unspecified abnormalities of gait and mobility: Secondary | ICD-10-CM

## 2022-01-23 DIAGNOSIS — Z803 Family history of malignant neoplasm of breast: Secondary | ICD-10-CM | POA: Insufficient documentation

## 2022-01-23 DIAGNOSIS — R000 Tachycardia, unspecified: Secondary | ICD-10-CM

## 2022-01-23 DIAGNOSIS — K921 Melena: Secondary | ICD-10-CM | POA: Diagnosis not present

## 2022-01-23 DIAGNOSIS — I82409 Acute embolism and thrombosis of unspecified deep veins of unspecified lower extremity: Secondary | ICD-10-CM | POA: Insufficient documentation

## 2022-01-23 DIAGNOSIS — Z87891 Personal history of nicotine dependence: Secondary | ICD-10-CM | POA: Insufficient documentation

## 2022-01-23 DIAGNOSIS — R52 Pain, unspecified: Secondary | ICD-10-CM

## 2022-01-23 LAB — HEPATIC FUNCTION PANEL
ALT: 60 — AB (ref 10–40)
AST: 57 — AB (ref 14–40)
Alkaline Phosphatase: 98 (ref 25–125)
Bilirubin, Total: 1

## 2022-01-23 LAB — COMPREHENSIVE METABOLIC PANEL
Albumin: 4.5 (ref 3.5–5.0)
Calcium: 9.2 (ref 8.7–10.7)

## 2022-01-23 LAB — CBC: RBC: 4.86 (ref 3.87–5.11)

## 2022-01-23 LAB — CBC AND DIFFERENTIAL
HCT: 43 (ref 41–53)
Hemoglobin: 15 (ref 13.5–17.5)
Neutrophils Absolute: 4.88
Platelets: 280 (ref 150–399)
WBC: 7.5

## 2022-01-23 LAB — BASIC METABOLIC PANEL
BUN: 10 (ref 4–21)
CO2: 29 — AB (ref 13–22)
Chloride: 100 (ref 99–108)
Creatinine: 1 (ref 0.6–1.3)
Glucose: 104
Potassium: 3.9 (ref 3.4–5.3)
Sodium: 137 (ref 137–147)

## 2022-01-23 LAB — ANTITHROMBIN III: AntiThromb III Func: 102 % (ref 75–120)

## 2022-01-24 LAB — CARDIOLIPIN ANTIBODIES, IGM+IGG
Anticardiolipin IgG: <9 GPL U/mL (ref 0–14)
Anticardiolipin IgM: 10 MPL U/mL (ref 0–12)

## 2022-01-24 LAB — BETA 2 MICROGLOBULIN, SERUM: Beta-2 Microglobulin: 1.6 mg/L (ref 0.6–2.4)

## 2022-01-24 LAB — PROTEIN S, TOTAL AND FREE
Protein S Ag, Free: 129 % (ref 61–136)
Protein S Ag, Total: 123 % (ref 60–150)

## 2022-01-25 LAB — HEXAGONAL PHASE PHOSPHOLIPID: Hexagonal Phase Phospholipid: 6 s (ref 0–11)

## 2022-01-25 LAB — LUPUS ANTICOAGULANT PANEL
DRVVT: 80.4 s — ABNORMAL HIGH (ref 0.0–47.0)
PTT Lupus Anticoagulant: 46.2 s — ABNORMAL HIGH (ref 0.0–43.5)

## 2022-01-25 LAB — PTT-LA MIX: PTT-LA Mix: 42.4 s — ABNORMAL HIGH (ref 0.0–40.5)

## 2022-01-25 LAB — DRVVT CONFIRM: dRVVT Confirm: 1.6 ratio — ABNORMAL HIGH (ref 0.8–1.2)

## 2022-01-25 LAB — DRVVT MIX: dRVVT Mix: 54.6 s — ABNORMAL HIGH (ref 0.0–40.4)

## 2022-01-25 LAB — PROTEIN C, TOTAL: Protein C, Total: 138 % (ref 60–150)

## 2022-01-26 LAB — FACTOR 5 LEIDEN

## 2022-01-27 LAB — PROTHROMBIN GENE MUTATION

## 2022-01-28 ENCOUNTER — Other Ambulatory Visit: Payer: Self-pay | Admitting: Oncology

## 2022-01-28 DIAGNOSIS — R760 Raised antibody titer: Secondary | ICD-10-CM | POA: Insufficient documentation

## 2022-01-30 ENCOUNTER — Telehealth: Payer: Self-pay

## 2022-01-30 NOTE — Telephone Encounter (Signed)
-----   Message from Christine H McCarty, MD sent at 01/28/2022  2:32 PM EST ----- °Regarding: call °Tell him the tests for lupus anticoagulant are positive and I think he may have that.  This is not inherited but can occur later in life.  The one panel is positive, the anticardiolipin is normal and a 3rd part is still pending.  His blood thinner could be affecting the results.  It is recommended that this test be repeated in 12 weeks to confirm. So I would like to schedule a return appt with labs in 3 months. °Ideally he should hold the Eliquis for 2 weeks prior to that appt.  If we confirm, it does not change his management, just confirms that he needs to stay on anticoagulation for life. [facility]s [facility] Dr[facility] Aaron, along with all of the labs, and this note if possible. ° ° °

## 2022-01-30 NOTE — Telephone Encounter (Signed)
Patient notified. Labs will be faxed to Dr. Marjory Lies at the [facility] in [place].

## 2022-01-31 ENCOUNTER — Telehealth: Payer: Self-pay

## 2022-01-31 DIAGNOSIS — I82409 Acute embolism and thrombosis of unspecified deep veins of unspecified lower extremity: Secondary | ICD-10-CM | POA: Insufficient documentation

## 2022-01-31 DIAGNOSIS — Z87820 Personal history of traumatic brain injury: Secondary | ICD-10-CM | POA: Insufficient documentation

## 2022-01-31 DIAGNOSIS — S060X1A Concussion with loss of consciousness of 30 minutes or less, initial encounter: Secondary | ICD-10-CM | POA: Insufficient documentation

## 2022-01-31 DIAGNOSIS — G479 Sleep disorder, unspecified: Secondary | ICD-10-CM | POA: Insufficient documentation

## 2022-01-31 DIAGNOSIS — Z7901 Long term (current) use of anticoagulants: Secondary | ICD-10-CM | POA: Insufficient documentation

## 2022-01-31 DIAGNOSIS — F4320 Adjustment disorder, unspecified: Secondary | ICD-10-CM | POA: Insufficient documentation

## 2022-01-31 DIAGNOSIS — K0262 Dental caries on smooth surface penetrating into dentin: Secondary | ICD-10-CM | POA: Insufficient documentation

## 2022-01-31 DIAGNOSIS — N3281 Overactive bladder: Secondary | ICD-10-CM | POA: Insufficient documentation

## 2022-01-31 DIAGNOSIS — F419 Anxiety disorder, unspecified: Secondary | ICD-10-CM | POA: Insufficient documentation

## 2022-01-31 DIAGNOSIS — S8261XA Displaced fracture of lateral malleolus of right fibula, initial encounter for closed fracture: Secondary | ICD-10-CM | POA: Insufficient documentation

## 2022-01-31 DIAGNOSIS — B360 Pityriasis versicolor: Secondary | ICD-10-CM | POA: Insufficient documentation

## 2022-01-31 DIAGNOSIS — K0252 Dental caries on pit and fissure surface penetrating into dentin: Secondary | ICD-10-CM | POA: Insufficient documentation

## 2022-01-31 DIAGNOSIS — G4489 Other headache syndrome: Secondary | ICD-10-CM | POA: Insufficient documentation

## 2022-01-31 DIAGNOSIS — K036 Deposits [accretions] on teeth: Secondary | ICD-10-CM | POA: Insufficient documentation

## 2022-01-31 DIAGNOSIS — Z789 Other specified health status: Secondary | ICD-10-CM | POA: Insufficient documentation

## 2022-01-31 DIAGNOSIS — G4730 Sleep apnea, unspecified: Secondary | ICD-10-CM | POA: Insufficient documentation

## 2022-01-31 DIAGNOSIS — Z86718 Personal history of other venous thrombosis and embolism: Secondary | ICD-10-CM | POA: Insufficient documentation

## 2022-01-31 DIAGNOSIS — M549 Dorsalgia, unspecified: Secondary | ICD-10-CM | POA: Insufficient documentation

## 2022-01-31 DIAGNOSIS — G4700 Insomnia, unspecified: Secondary | ICD-10-CM | POA: Insufficient documentation

## 2022-01-31 DIAGNOSIS — Z658 Other specified problems related to psychosocial circumstances: Secondary | ICD-10-CM | POA: Insufficient documentation

## 2022-01-31 DIAGNOSIS — Z13850 Encounter for screening for traumatic brain injury: Secondary | ICD-10-CM | POA: Insufficient documentation

## 2022-01-31 NOTE — Telephone Encounter (Signed)
Faxed lab results and last office note to Dr. Clifton Custard at the [facility] in [place].

## 2022-01-31 NOTE — Telephone Encounter (Signed)
-----   Message from Derwood Kaplan, MD sent at 01/28/2022  2:32 PM EST ----- Regarding: call Tell him the tests for lupus anticoagulant are positive and I think he may have that.  This is not inherited but can occur later in life.  The one panel is positive, the anticardiolipin is normal and a 3rd part is still pending.  His blood thinner could be affecting the results.  It is recommended that this test be repeated in 12 weeks to confirm. So I would like to schedule a return appt with labs in 3 months. Ideally he should hold the Eliquis for 2 weeks prior to that appt.  If we confirm, it does not change his management, just confirms that he needs to stay on anticoagulation for life. Pls send a copy of my note to [facility], c/o Dr. Marjory Lies, along with all of the labs, and this note if possible.

## 2022-05-01 ENCOUNTER — Inpatient Hospital Stay: Payer: No Typology Code available for payment source | Attending: Oncology | Admitting: Oncology

## 2022-05-01 ENCOUNTER — Inpatient Hospital Stay: Payer: No Typology Code available for payment source
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
